# Patient Record
Sex: Male | Born: 1985
Health system: Southern US, Community
[De-identification: ages and names within clinical notes are randomized; demographics above are authoritative.]

## PROBLEM LIST (undated history)

## (undated) DIAGNOSIS — I10 Essential (primary) hypertension: Secondary | ICD-10-CM

## (undated) HISTORY — PX: APPENDECTOMY: SHX54

---
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
## 2007-01-07 ENCOUNTER — Other Ambulatory Visit: Payer: Self-pay

## 2007-01-07 ENCOUNTER — Emergency Department: Payer: Self-pay | Admitting: Emergency Medicine

## 2007-02-03 ENCOUNTER — Ambulatory Visit: Payer: Self-pay | Admitting: Unknown Physician Specialty

## 2007-02-19 ENCOUNTER — Emergency Department: Payer: Self-pay | Admitting: Unknown Physician Specialty

## 2007-03-06 ENCOUNTER — Emergency Department: Payer: Self-pay | Admitting: Emergency Medicine

## 2007-03-06 ENCOUNTER — Other Ambulatory Visit: Payer: Self-pay

## 2007-03-26 ENCOUNTER — Emergency Department: Payer: Self-pay | Admitting: Emergency Medicine

## 2007-05-22 ENCOUNTER — Emergency Department: Payer: Self-pay | Admitting: Emergency Medicine

## 2007-06-06 ENCOUNTER — Inpatient Hospital Stay: Payer: Self-pay | Admitting: Surgery

## 2008-02-03 ENCOUNTER — Emergency Department: Payer: Self-pay | Admitting: Emergency Medicine

## 2008-05-13 IMAGING — CT CT ABD-PELV W/ CM
1 of 2 series · 15 of 32 positions shown, 19 images · non-contrast
Comparison: none

REASON FOR EXAM: Abdominal pain, started in upper mid - now right mid, WBC
22k, worrisome for appendicitis
COMMENTS:   LMP: (Male)

[Series 2: appendicitis · axial · 0.79mm/px · z∈[-458,-2]mm · 15 of 166 slices shown, 19 images]
[im 7/166  soft-tissue]
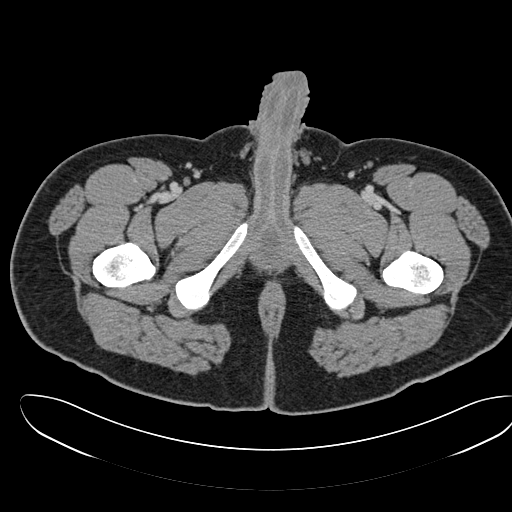
[im 7/166  bone]
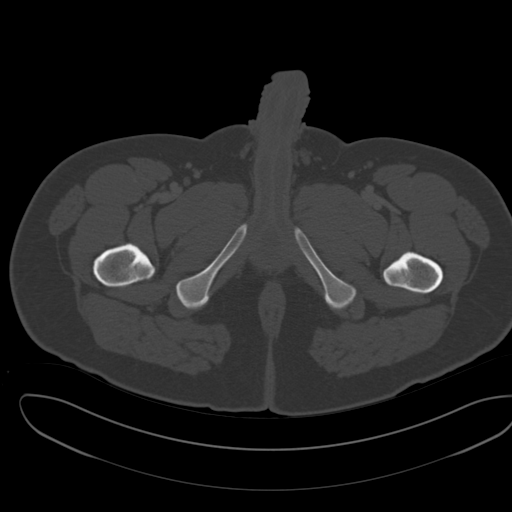
[im 20/166  soft-tissue]
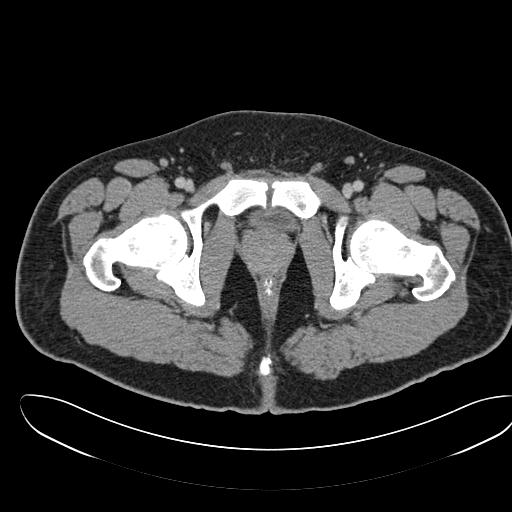
[im 32/166  soft-tissue]
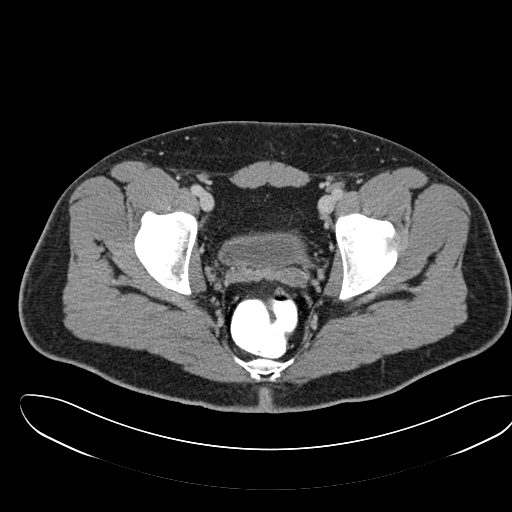
[im 45/166  soft-tissue]
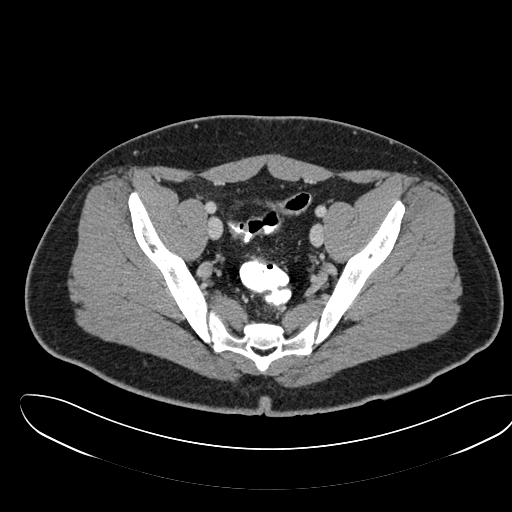
[im 58/166  soft-tissue]
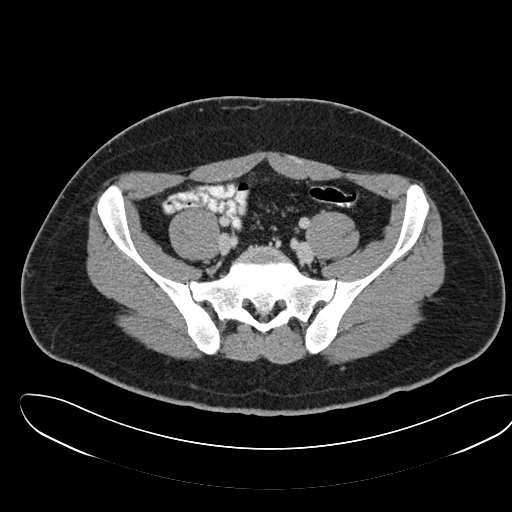
[im 70/166  soft-tissue]
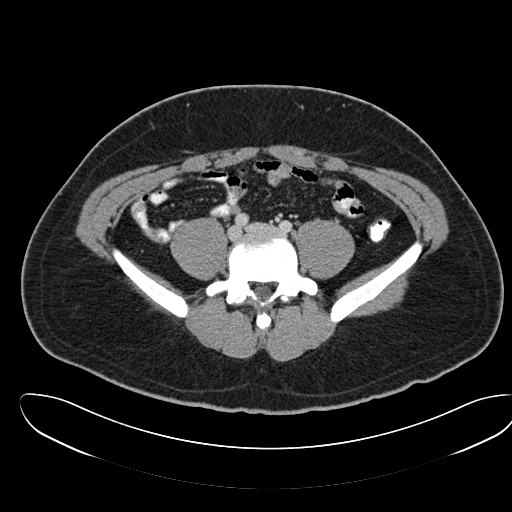
[im 83/166  soft-tissue]
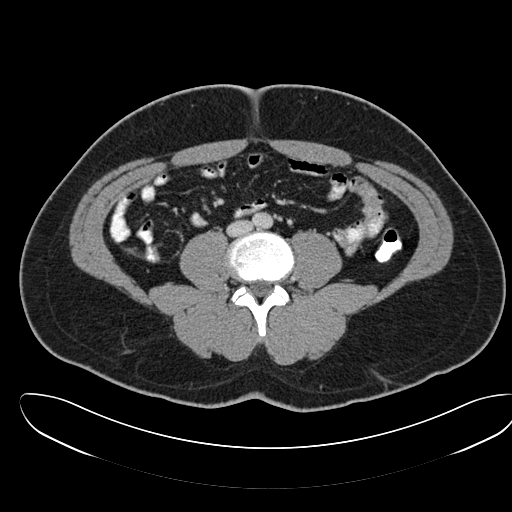
[im 96/166  soft-tissue]
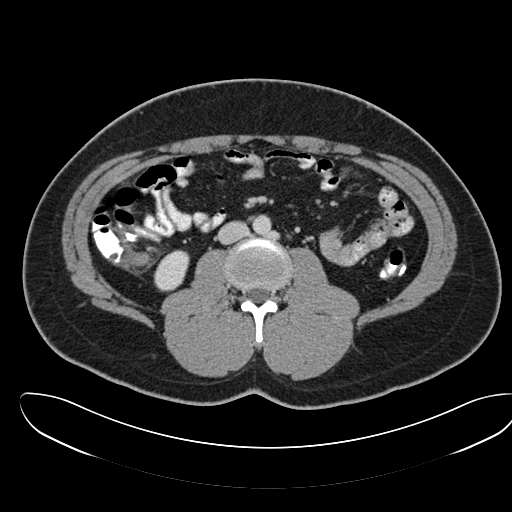
[im 108/166  soft-tissue]
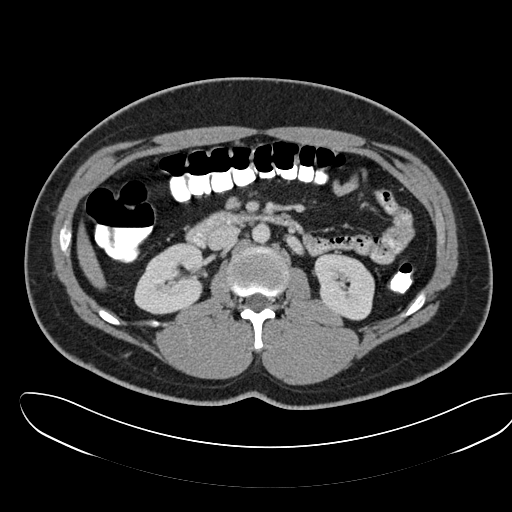
[im 108/166  bone]
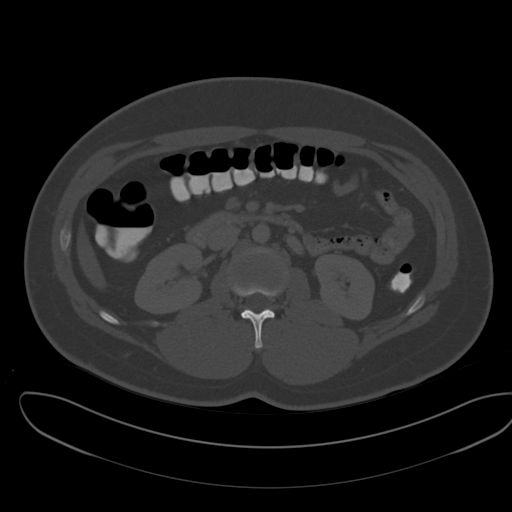
[im 121/166  soft-tissue]
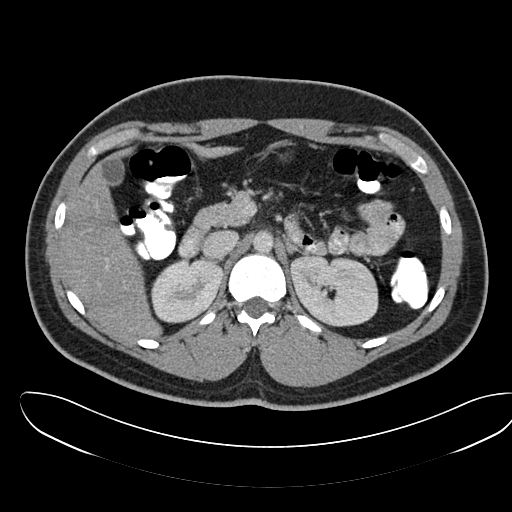
[im 134/166  soft-tissue]
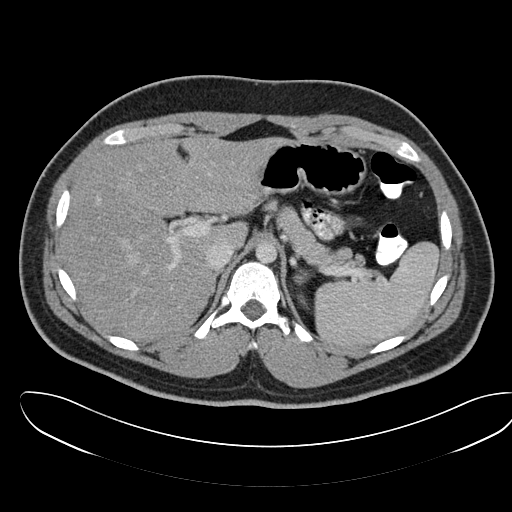
[im 140/166  lung]
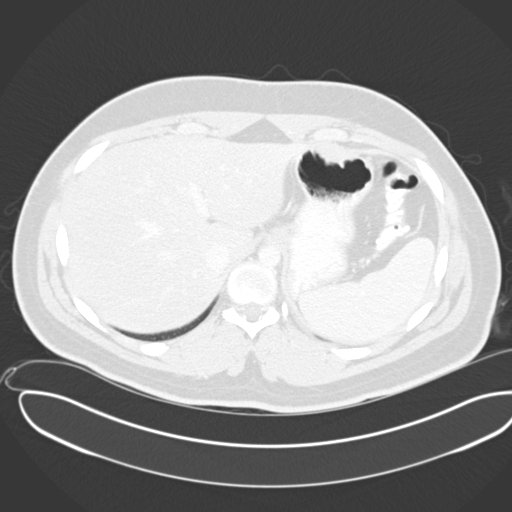
[im 146/166  soft-tissue]
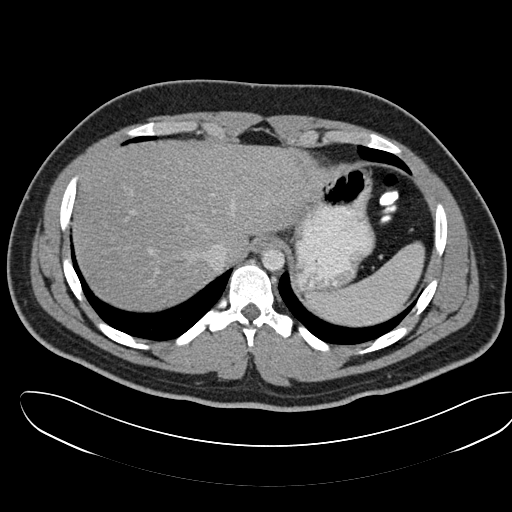
[im 146/166  lung]
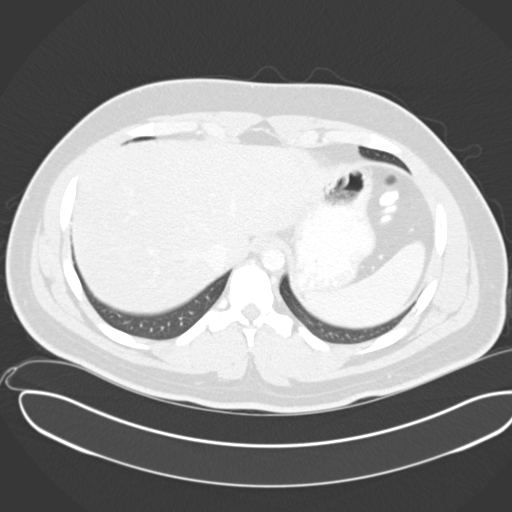
[im 153/166  lung]
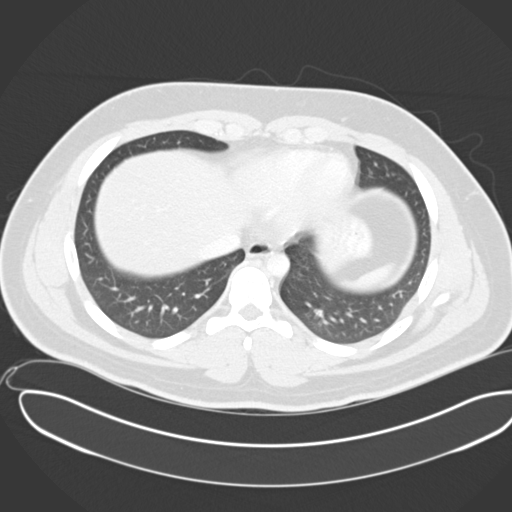
[im 159/166  soft-tissue]
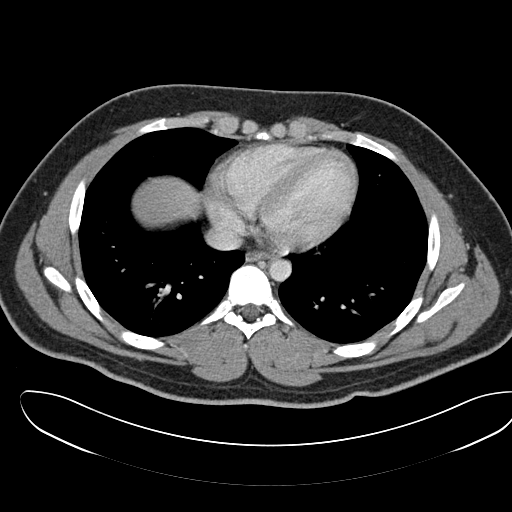
[im 159/166  lung]
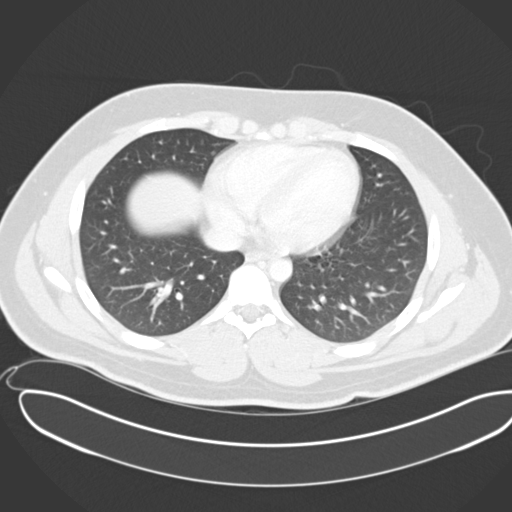

[15 of 32 positions shown; findings below may reference images not displayed]

PROCEDURE:     CT  - CT ABDOMEN / PELVIS  W  - June 06, 2007  [DATE]

RESULT:     Emergent CT scan of the abdomen and pelvis is performed
utilizing 100 ml of [id number] along with oral contrast.

Comparison is made to previous noncontrast images dated 12/23/2006 and
09/10/2006.

There is thickening of the wall of the appendix with periappendiceal
inflammatory stranding. There is no free air or free fluid. There is no
abscess formation evident. There is no bowel obstruction. The urinary
bladder is nondistended. There is some scattered diverticulosis in the
distal descending colon. The kidneys enhance normally. The aorta, liver,
spleen, pancreas and gallbladder are unremarkable. The lung bases appear to
be normally aerated.
IMPRESSION: Findings positive for appendicitis. There is no abscess
formation or evidence of perforation.

## 2008-07-15 ENCOUNTER — Emergency Department: Payer: Self-pay | Admitting: Emergency Medicine

## 2008-09-24 ENCOUNTER — Emergency Department: Payer: Self-pay | Admitting: Emergency Medicine

## 2009-01-08 ENCOUNTER — Emergency Department (HOSPITAL_COMMUNITY): Admission: EM | Admit: 2009-01-08 | Discharge: 2009-01-08 | Payer: Self-pay | Admitting: Emergency Medicine

## 2009-12-10 ENCOUNTER — Emergency Department: Payer: Self-pay | Admitting: Unknown Physician Specialty

## 2010-01-24 ENCOUNTER — Emergency Department: Payer: Self-pay | Admitting: Emergency Medicine

## 2010-03-05 ENCOUNTER — Emergency Department: Payer: Self-pay | Admitting: Emergency Medicine

## 2010-04-07 ENCOUNTER — Emergency Department: Payer: Self-pay | Admitting: Emergency Medicine

## 2010-06-04 ENCOUNTER — Emergency Department: Payer: Self-pay | Admitting: Emergency Medicine

## 2010-10-08 ENCOUNTER — Emergency Department: Payer: Self-pay | Admitting: Emergency Medicine

## 2011-04-04 ENCOUNTER — Emergency Department: Payer: Self-pay | Admitting: Internal Medicine

## 2012-05-26 ENCOUNTER — Emergency Department: Payer: Self-pay | Admitting: Emergency Medicine

## 2012-05-26 LAB — BASIC METABOLIC PANEL
Anion Gap: 9 (ref 7–16)
Calcium, Total: 9.1 mg/dL (ref 8.5–10.1)
Chloride: 108 mmol/L — ABNORMAL HIGH (ref 98–107)
Co2: 26 mmol/L (ref 21–32)
Osmolality: 285 (ref 275–301)

## 2012-05-26 LAB — CBC
HCT: 46.9 % (ref 40.0–52.0)
HGB: 16.5 g/dL (ref 13.0–18.0)
MCHC: 35.2 g/dL (ref 32.0–36.0)
MCV: 92 fL (ref 80–100)
Platelet: 296 10*3/uL (ref 150–440)
RBC: 5.09 10*6/uL (ref 4.40–5.90)
RDW: 12.3 % (ref 11.5–14.5)

## 2013-07-22 ENCOUNTER — Emergency Department: Payer: Self-pay | Admitting: Emergency Medicine

## 2015-10-18 ENCOUNTER — Emergency Department
Admission: EM | Admit: 2015-10-18 | Discharge: 2015-10-18 | Disposition: A | Discharge status: Home / Self Care | Payer: Self-pay | Attending: Emergency Medicine | Admitting: Emergency Medicine

## 2015-10-18 ENCOUNTER — Encounter: Payer: Self-pay | Admitting: Medical Oncology

## 2015-10-18 DIAGNOSIS — J302 Other seasonal allergic rhinitis: Secondary | ICD-10-CM | POA: Insufficient documentation

## 2015-10-18 DIAGNOSIS — I10 Essential (primary) hypertension: Secondary | ICD-10-CM | POA: Insufficient documentation

## 2015-10-18 DIAGNOSIS — R42 Dizziness and giddiness: Secondary | ICD-10-CM | POA: Insufficient documentation

## 2015-10-18 HISTORY — DX: Essential (primary) hypertension: I10

## 2015-10-18 LAB — CBC WITH DIFFERENTIAL/PLATELET
BASOS ABS: 0.1 10*3/uL (ref 0–0.1)
BASOS PCT: 1 %
EOS PCT: 11 %
Eosinophils Absolute: 0.9 10*3/uL — ABNORMAL HIGH (ref 0–0.7)
HCT: 45.4 % (ref 40.0–52.0)
Hemoglobin: 15.9 g/dL (ref 13.0–18.0)
Lymphocytes Relative: 33 %
Lymphs Abs: 2.9 10*3/uL (ref 1.0–3.6)
MCH: 31.6 pg (ref 26.0–34.0)
MCHC: 35.1 g/dL (ref 32.0–36.0)
MCV: 90 fL (ref 80.0–100.0)
MONO ABS: 0.7 10*3/uL (ref 0.2–1.0)
Monocytes Relative: 8 %
Neutro Abs: 4.2 10*3/uL (ref 1.4–6.5)
Neutrophils Relative %: 47 %
PLATELETS: 276 10*3/uL (ref 150–440)
RBC: 5.04 MIL/uL (ref 4.40–5.90)
RDW: 13.1 % (ref 11.5–14.5)
WBC: 8.8 10*3/uL (ref 3.8–10.6)

## 2015-10-18 LAB — COMPREHENSIVE METABOLIC PANEL
ALBUMIN: 4.8 g/dL (ref 3.5–5.0)
ALT: 39 U/L (ref 17–63)
AST: 29 U/L (ref 15–41)
Alkaline Phosphatase: 65 U/L (ref 38–126)
Anion gap: 6 (ref 5–15)
BUN: 12 mg/dL (ref 6–20)
CHLORIDE: 106 mmol/L (ref 101–111)
CO2: 25 mmol/L (ref 22–32)
Calcium: 9.3 mg/dL (ref 8.9–10.3)
Creatinine, Ser: 0.92 mg/dL (ref 0.61–1.24)
GFR calc Af Amer: >60 mL/min (ref >60)
GFR calc non Af Amer: >60 mL/min (ref >60)
GLUCOSE: 101 mg/dL — AB (ref 65–99)
POTASSIUM: 3.9 mmol/L (ref 3.5–5.1)
SODIUM: 137 mmol/L (ref 135–145)
Total Bilirubin: 1 mg/dL (ref 0.3–1.2)
Total Protein: 8.1 g/dL (ref 6.5–8.1)

## 2015-10-18 LAB — TROPONIN I

## 2015-10-18 MED ORDER — FLUTICASONE PROPIONATE 50 MCG/ACT NA SUSP
2.0000 | Freq: Every day | NASAL | Status: DC
Start: 2015-10-18 — End: 2020-03-20

## 2015-10-18 NOTE — ED Notes (Signed)
Pt reports that he began having dizziness this am. Pt reports history of same in past when he was diagnosed with HTN. Pt reports he has been compliant with his BP meds. Pt denies pain.

## 2015-10-18 NOTE — Discharge Instructions (Signed)
Allergies An allergy is an abnormal reaction to a substance by the body's defense system (immune system). Allergies can develop at any age. WHAT CAUSES ALLERGIES? An allergic reaction happens when the immune system mistakenly reacts to a normally harmless substance, called an allergen, as if it were harmful. The immune system releases antibodies to fight the substance. Antibodies eventually release a chemical called histamine into the bloodstream. The release of histamine is meant to protect the body from infection, but it also causes discomfort. An allergic reaction can be triggered by:  Eating an allergen.  Inhaling an allergen.  Touching an allergen. WHAT TYPES OF ALLERGIES ARE THERE? There are many types of allergies. Common types include:  Seasonal allergies. People with this type of allergy are usually allergic to substances that are only present during certain seasons, such as molds and pollens.  Food allergies.  Drug allergies.  Insect allergies.  Animal dander allergies. WHAT ARE SYMPTOMS OF ALLERGIES? Possible allergy symptoms include:  Swelling of the lips, face, tongue, mouth, or throat.  Sneezing, coughing, or wheezing.  Nasal congestion.  Tingling in the mouth.  Rash.  Itching.  Itchy, red, swollen areas of skin (hives).  Watery eyes.  Vomiting.  Diarrhea.  Dizziness.  Lightheadedness.  Fainting.  Trouble breathing or swallowing.  Chest tightness.  Rapid heartbeat. HOW ARE ALLERGIES DIAGNOSED? Allergies are diagnosed with a medical and family history and one or more of the following:  Skin tests.  Blood tests.  A food diary. A food diary is a record of all the foods and drinks you have in a day and of all the symptoms you experience.  The results of an elimination diet. An elimination diet involves eliminating foods from your diet and then adding them back in one by one to find out if a certain food causes an allergic reaction. HOW ARE  ALLERGIES TREATED? There is no cure for allergies, but allergic reactions can be treated with medicine. Severe reactions usually need to be treated at a hospital. HOW CAN REACTIONS BE PREVENTED? The best way to prevent an allergic reaction is by avoiding the substance you are allergic to. Allergy shots and medicines can also help prevent reactions in some cases. People with severe allergic reactions may be able to prevent a life-threatening reaction called anaphylaxis with a medicine given right after exposure to the allergen.   This information is not intended to replace advice given to you by your health care provider. Make sure you discuss any questions you have with your health care provider.   Document Released: 10/20/2002 Document Revised: 08/17/2014 Document Reviewed: 05/08/2014 Elsevier Interactive Patient Education 2016 Else[employer] Inc.  Dizziness Dizziness is a common problem. It is a feeling of unsteadiness or light-headedness. You may feel like you are about to faint. Dizziness can lead to injury if you stumble or fall. Anyone can become dizzy, but dizziness is more common in older adults. This condition can be caused by a number of things, including medicines, dehydration, or illness. HOME CARE INSTRUCTIONS Taking these steps may help with your condition: Eating and Drinking  Drink enough fluid to keep your urine clear or pale yellow. This helps to keep you from becoming dehydrated. Try to drink more clear fluids, such as water.  Do not drink alcohol.  Limit your caffeine intake if directed by your health care provider.  Limit your salt intake if directed by your health care provider. Activity  Avoid making quick movements.  Rise slowly from chairs and steady yourself  until you feel okay.  In the morning, first sit up on the side of the bed. When you feel okay, stand slowly while you hold onto something until you know that your balance is fine.  Move your legs often if you  need to stand in one place for a long time. Tighten and relax your muscles in your legs while you are standing.  Do not drive or operate heavy machinery if you feel dizzy.  Avoid bending down if you feel dizzy. Place items in your home so that they are easy for you to reach without leaning over. Lifestyle  Do not use any tobacco products, including cigarettes, chewing tobacco, or electronic cigarettes. If you need help quitting, ask your health care provider.  Try to reduce your stress level, such as with yoga or meditation. Talk with your health care provider if you need help. General Instructions  Watch your dizziness for any changes.  Take medicines only as directed by your health care provider. Talk with your health care provider if you think that your dizziness is caused by a medicine that you are taking.  Tell a friend or a family member that you are feeling dizzy. If he or she notices any changes in your behavior, have this person call your health care provider.  Keep all follow-up visits as directed by your health care provider. This is important. SEEK MEDICAL CARE IF:  Your dizziness does not go away.  Your dizziness or light-headedness gets worse.  You feel nauseous.  You have reduced hearing.  You have new symptoms.  You are unsteady on your feet or you feel like the room is spinning. SEEK IMMEDIATE MEDICAL CARE IF:  You vomit or have diarrhea and are unable to eat or drink anything.  You have problems talking, walking, swallowing, or using your arms, hands, or legs.  You feel generally weak.  You are not thinking clearly or you have trouble forming sentences. It may take a friend or family member to notice this.  You have chest pain, abdominal pain, shortness of breath, or sweating.  Your vision changes.  You notice any bleeding.  You have a headache.  You have neck pain or a stiff neck.  You have a fever.   This information is not intended to replace  advice given to you by your health care provider. Make sure you discuss any questions you have with your health care provider.   Document Released: 01/20/2001 Document Revised: 12/11/2014 Document Reviewed: 07/23/2014 Elsevier Interactive Patient Education [employer]2016 Elsevier Inc.

## 2015-10-18 NOTE — ED Notes (Signed)
Pt states that he is dizzy most every morning he gets up, states that he mostly gets straight up out if bed without sitting on the side first. Pt also states that he noticed when he laying down looking back or side to side he becomes dizzy as well, pt denies hx of vertigo or inner ear issues, pt states that he is always concerned that either his bp is too high or too low, pt states that his primary dr has been adjusting his bp meds, pt also states that he notices that he has been very tired lately. No distress noted at this time and denies any pain, cont to monitor

## 2015-10-18 NOTE — ED Provider Notes (Addendum)
[facility] Emergency Department Provider Note     Time seen: ----------------------------------------- 8:49 AM on 10/18/2015 -----------------------------------------    I have reviewed the triage vital signs and the nursing notes.   HISTORY  Chief Complaint Dizziness    HPI Collin Mccarthy is a 30 y.o. male who presents to ER with dizziness that started this morning. Patient states he had history of same and was diagnosed with high blood pressure. Patient states been compliant with his blood pressure medications. Currently he is having some allergy or sinus symptoms, nothing makes his symptoms better or worse.   Past Medical History  Diagnosis Date  . Hypertension     There are no active problems to display for this patient.   History reviewed. No pertinent past surgical history.  Allergies Review of patient's allergies indicates no known allergies.  Social History Social History  Substance Use Topics  . Smoking status: Never Smoker   . Smokeless tobacco: None  . Alcohol Use: None    Review of Systems Constitutional: Negative for fever. Eyes: Negative for visual changes. ENT: Negative for sore throat.Positive for sinus congestion Cardiovascular: Negative for chest pain. Respiratory: Negative for shortness of breath. Gastrointestinal: Negative for abdominal pain, vomiting and diarrhea. Genitourinary: Negative for dysuria. Musculoskeletal: Negative for back pain. Skin: Negative for rash. Neurological: Negative for headaches, focal weakness or numbness.Positive for dizziness  10-point ROS otherwise negative.  ____________________________________________   PHYSICAL EXAM:  VITAL SIGNS: ED Triage Vitals  Enc Vitals Group     BP 10/18/15 0842 154/95 mmHg     Pulse Rate 10/18/15 0842 68     Resp 10/18/15 0842 17     Temp 10/18/15 0842 97.6 F (36.4 C)     Temp Source 10/18/15 0842 Oral     SpO2 10/18/15 0842 100 %   Weight 10/18/15 0842 250 lb (113.399 kg)     Height 10/18/15 0842  (1.727 m)     Head Cir --      Peak Flow --      Pain Score --      Pain Loc --      Pain Edu? --      Excl. in GC? --     Constitutional: Alert and oriented. Well appearing and in no distress. Eyes: Conjunctivae are normal. PERRL. Normal extraocular movements. ENT   Head: Normocephalic and atraumatic.   Nose: No congestion/rhinnorhea.   Mouth/Throat: Mucous membranes are moist.   Neck: No stridor. Cardiovascular: Normal rate, regular rhythm. Normal and symmetric distal pulses are present in all extremities. No murmurs, rubs, or gallops. Respiratory: Normal respiratory effort without tachypnea nor retractions. Breath sounds are clear and equal bilaterally. No wheezes/rales/rhonchi. Gastrointestinal: Soft and nontender. No distention. No abdominal bruits.  Musculoskeletal: Nontender with normal range of motion in all extremities. No joint effusions.  No lower extremity tenderness nor edema. Neurologic:  Normal speech and language. No gross focal neurologic deficits are appreciated. Speech is normal. No gait instability. Skin:  Skin is warm, dry and intact. No rash noted. Psychiatric: Mood and affect are normal. Speech and behavior are normal. Patient exhibits appropriate insight and judgment. ____________________________________________  EKG: Interpreted by me. Normal sinus rhythm with a rate of 79 bpm, normal PR interval, normal QRS, normal QT interval. Normal EKG  ____________________________________________  ED COURSE:  Pertinent labs & imaging results that were available during my care of the patient were reviewed by me and considered in my medical decision making (see chart for  details). Patient looks well, likely multifactorial. We'll check basic labs and reevaluate. ____________________________________________    LABS (pertinent positives/negatives)  Labs Reviewed  CBC WITH  DIFFERENTIAL/PLATELET - Abnormal; Notable for the following:    Eosinophils Absolute 0.9 (*)    All other components within normal limits  COMPREHENSIVE METABOLIC PANEL - Abnormal; Notable for the following:    Glucose, Bld 101 (*)    All other components within normal limits  TROPONIN I   ____________________________________________  FINAL ASSESSMENT AND PLAN  Dizziness  Plan: Patient with labs as dictated above. Patient is in no acute distress, labs are unremarkable. He'll be discharged with Flonase for seasonal allergy and encouraged to have close follow-up with his doctor for recheck. I suspect whenever his sinuses become inflamed or when he is exposed to a seasonal allergen this triggers mild vertigo.   Emily FilbertWilliams, Tiaria Biby E, MD   Emily FilbertJonathan E Manvir Prabhu, MD 10/18/15 [id number]  Emily FilbertJonathan E Kerim Statzer, MD 10/18/15 [phone]

## 2016-06-18 ENCOUNTER — Emergency Department
Admission: EM | Admit: 2016-06-18 | Discharge: 2016-06-18 | Disposition: A | Discharge status: Home / Self Care | Payer: Self-pay | Attending: Emergency Medicine | Admitting: Emergency Medicine

## 2016-06-18 ENCOUNTER — Encounter: Payer: Self-pay | Admitting: Emergency Medicine

## 2016-06-18 ENCOUNTER — Emergency Department: Payer: Self-pay

## 2016-06-18 DIAGNOSIS — Z7951 Long term (current) use of inhaled steroids: Secondary | ICD-10-CM | POA: Insufficient documentation

## 2016-06-18 DIAGNOSIS — G629 Polyneuropathy, unspecified: Secondary | ICD-10-CM | POA: Insufficient documentation

## 2016-06-18 DIAGNOSIS — R202 Paresthesia of skin: Secondary | ICD-10-CM

## 2016-06-18 DIAGNOSIS — I10 Essential (primary) hypertension: Secondary | ICD-10-CM | POA: Insufficient documentation

## 2016-06-18 MED ORDER — METHYLPREDNISOLONE 4 MG PO TBPK
ORAL_TABLET | ORAL | 0 refills | Status: DC
Start: 2016-06-18 — End: 2020-03-20

## 2016-06-18 MED ORDER — TRAMADOL HCL 50 MG PO TABS: 50.0000 mg | ORAL_TABLET | Freq: Four times a day (QID) | ORAL | 0 refills | Status: AC | PRN

## 2016-06-18 MED ORDER — BENZONATATE 100 MG PO CAPS
200.0000 mg | ORAL_CAPSULE | Freq: Once | ORAL | Status: AC
  Administered 2016-06-18: 200 mg via ORAL
  Filled 2016-06-18: qty 2

## 2016-06-18 NOTE — Discharge Instructions (Signed)
Wear wrist support at work.

## 2016-06-18 NOTE — ED Provider Notes (Signed)
[facility] Emergency Department Provider Note   ____________________________________________   First MD Initiated Contact with Patient 06/18/16 0745     (approximate)  I have reviewed the triage vital signs and the nursing notes.   HISTORY  Chief Complaint Numbness    HPI Collin Mccarthy is a 30 y.o. male patient complaining the numbness to the right hand for 1 month. Paced the incident occurred after he struck his distal radius with a hammer on changing brakes. Patient state his has been seen by his PCP with no definitive diagnosis. Patient state he is having intermittent pain and tingling to first through third digits. Patient denies loss of strength. Patient also has a nonproductive cough for 4 days. Patient denies any other URI signs symptoms.Patient denies pain.   Past Medical History:  Diagnosis Date  . Hypertension     There are no active problems to display for this patient.   History reviewed. No pertinent surgical history.  Prior to Admission medications   Medication Sig Start Date End Date Taking? Authorizing Provider  fluticasone (FLONASE) 50 MCG/ACT nasal spray Place 2 sprays into both nostrils daily. 10/18/15 10/17/16  Emily FilbertJonathan E Williams, MD  methylPREDNISolone (MEDROL DOSEPAK) 4 MG TBPK tablet Take Tapered dose as directed 06/18/16   Joni Reiningonald K Smith, PA-C  traMADol (ULTRAM) 50 MG tablet Take 1 tablet (50 mg total) by mouth every 6 (six) hours as needed. 06/18/16 06/18/17  Joni Reiningonald K Smith, PA-C    Allergies Patient has no known allergies.  No family history on file.  Social History Social History  Substance Use Topics  . Smoking status: Never Smoker  . Smokeless tobacco: Never Used  . Alcohol use Not on file    Review of Systems Constitutional: No fever/chills Eyes: No visual changes. ENT: No sore throat. Cardiovascular: Denies chest pain. Respiratory: Denies shortness of breath. Gastrointestinal: No abdominal pain.   No nausea, no vomiting.  No diarrhea.  No constipation. Genitourinary: Negative for dysuria. Musculoskeletal: Negative for back pain. Skin: Negative for rash. Neurological: Negative for headaches, focal weakness or numbness. No meningeal tingling to the right first through third digits.    ____________________________________________   PHYSICAL EXAM:  VITAL SIGNS: ED Triage Vitals  Enc Vitals Group     BP 06/18/16 0656 135/76     Pulse Rate 06/18/16 0656 84     Resp 06/18/16 0656 18     Temp 06/18/16 0656 98.1 F (36.7 C)     Temp Source 06/18/16 0656 Oral     SpO2 06/18/16 0656 98 %     Weight 06/18/16 0638 250 lb (113.4 kg)     Height 06/18/16 0638 5\' 9"  (1.753 m)     Head Circumference --      Peak Flow --      Pain Score 06/18/16 0723 0     Pain Loc --      Pain Edu? --      Excl. in GC? --     Constitutional: Alert and oriented. Well appearing and in no acute distress. Eyes: Conjunctivae are normal. PERRL. EOMI. Head: Atraumatic. Nose: No congestion/rhinnorhea. Mouth/Throat: Mucous membranes are moist.  Oropharynx non-erythematous. Neck: No stridor.  No cervical spine tenderness to palpation. Hematological/Lymphatic/Immunilogical: No cervical lymphadenopathy. Cardiovascular: Normal rate, regular rhythm. Grossly normal heart sounds.  Good peripheral circulation. Respiratory: Normal respiratory effort.  No retractions. Lungs CTAB. Nonproductive cough Gastrointestinal: Soft and nontender. No distention. No abdominal bruits. No CVA tenderness. Musculoskeletal: No lower extremity tenderness  nor edema.  No joint effusions. Neurologic:  Normal speech and language. No gross focal neurologic deficits are appreciated. No gait instability. Skin:  Skin is warm, dry and intact. No rash noted. Psychiatric: Mood and affect are normal. Speech and behavior are normal.  ____________________________________________   LABS (all labs ordered are listed, but only abnormal results  are displayed)  Labs Reviewed - No data to display ____________________________________________  EKG   ____________________________________________  RADIOLOGY  No acute findings x-ray of the right wrist ____________________________________________   PROCEDURES  Procedure(s) performed: None  Procedures  Critical Care performed: No  ____________________________________________   INITIAL IMPRESSION / ASSESSMENT AND PLAN / ED COURSE  Pertinent labs & imaging results that were available during my care of the patient were reviewed by me and considered in my medical decision making (see chart for details).  Neuropathy of the right hand. Discussed negative x-ray finding with patient. Patient given discharge Instructions. Patient advised follow-up with neurology for EMG studies.  Clinical Course    Patient placed in a hand splint and started on prednisone.  ____________________________________________   FINAL CLINICAL IMPRESSION(S) / ED DIAGNOSES  Final diagnoses:  Paresthesias      NEW MEDICATIONS STARTED DURING THIS VISIT:  New Prescriptions   METHYLPREDNISOLONE (MEDROL DOSEPAK) 4 MG TBPK TABLET    Take Tapered dose as directed   TRAMADOL (ULTRAM) 50 MG TABLET    Take 1 tablet (50 mg total) by mouth every 6 (six) hours as needed.     Note:  This document was prepared using Dragon voice recognition software and may include unintentional dictation errors.    Joni ReiningRonald K Smith, PA-C 06/18/16 [id number]    Myrna Blazeravid Matthew Schaevitz, MD 06/18/16 [phone]

## 2016-06-18 NOTE — ED Notes (Signed)
Patient presents to the ED with intermittent right hand pain and numbness.  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  Patient also is complaining of cough x 4 days.  Patient is in no obvious distress at this time.  Denies pain at this time.

## 2016-06-18 NOTE — ED Triage Notes (Signed)
Patient ambulatory to triage with steady gait, without difficulty or distress noted; pt reports right hand numbness x month; has seen PCP with no dx

## 2018-05-07 ENCOUNTER — Emergency Department: Payer: Medicaid Other

## 2018-05-07 ENCOUNTER — Other Ambulatory Visit: Payer: Self-pay

## 2018-05-07 ENCOUNTER — Encounter: Payer: Self-pay | Admitting: Emergency Medicine

## 2018-05-07 ENCOUNTER — Emergency Department
Admission: EM | Admit: 2018-05-07 | Discharge: 2018-05-07 | Disposition: A | Discharge status: Home / Self Care | Payer: Medicaid Other | Attending: Student in an Organized Health Care Education/Training Program | Admitting: Student in an Organized Health Care Education/Training Program

## 2018-05-07 DIAGNOSIS — R0789 Other chest pain: Secondary | ICD-10-CM | POA: Insufficient documentation

## 2018-05-07 DIAGNOSIS — I10 Essential (primary) hypertension: Secondary | ICD-10-CM | POA: Insufficient documentation

## 2018-05-07 LAB — CBC WITH DIFFERENTIAL/PLATELET
Basophils Absolute: 0.2 10*3/uL — ABNORMAL HIGH (ref 0–0.1)
Basophils Relative: 2 %
Eosinophils Absolute: 0.4 10*3/uL (ref 0–0.7)
Eosinophils Relative: 3 %
HCT: 47.4 % (ref 40.0–52.0)
Hemoglobin: 16.8 g/dL (ref 13.0–18.0)
Lymphocytes Relative: 33 %
Lymphs Abs: 4.5 10*3/uL — ABNORMAL HIGH (ref 1.0–3.6)
MCH: 32.7 pg (ref 26.0–34.0)
MCHC: 35.4 g/dL (ref 32.0–36.0)
MCV: 92.4 fL (ref 80.0–100.0)
Monocytes Absolute: 1.2 10*3/uL — ABNORMAL HIGH (ref 0.2–1.0)
Monocytes Relative: 9 %
Neutro Abs: 7.1 10*3/uL — ABNORMAL HIGH (ref 1.4–6.5)
Neutrophils Relative %: 53 %
Platelets: 351 10*3/uL (ref 150–440)
RBC: 5.13 MIL/uL (ref 4.40–5.90)
RDW: 12.8 % (ref 11.5–14.5)
WBC: 13.5 10*3/uL — ABNORMAL HIGH (ref 3.8–10.6)

## 2018-05-07 LAB — BASIC METABOLIC PANEL
Anion gap: 10 (ref 5–15)
BUN: 11 mg/dL (ref 6–20)
CO2: 25 mmol/L (ref 22–32)
Calcium: 9.7 mg/dL (ref 8.9–10.3)
Chloride: 103 mmol/L (ref 98–111)
Creatinine, Ser: 0.96 mg/dL (ref 0.61–1.24)
GFR calc Af Amer: >60 mL/min (ref >60)
GFR calc non Af Amer: >60 mL/min (ref >60)
Glucose, Bld: 112 mg/dL — ABNORMAL HIGH (ref 70–99)
Potassium: 3.4 mmol/L — ABNORMAL LOW (ref 3.5–5.1)
Sodium: 138 mmol/L (ref 135–145)

## 2018-05-07 LAB — TROPONIN I: Troponin I: <0.03 ng/mL (ref <0.03)

## 2018-05-07 LAB — FIBRIN DERIVATIVES D-DIMER (ARMC ONLY): Fibrin derivatives D-dimer (ARMC): 203.85 ng/mL (FEU) (ref 0.00–499.00)

## 2018-05-07 NOTE — ED Provider Notes (Signed)
[facility] Emergency Department Provider Note    First MD Initiated Contact with Patient 05/07/18 2024     (approximate)  I have reviewed the triage vital signs and the nursing notes.   HISTORY  Chief Complaint Chest Pain    HPI ZAION HREHA is a 32 y.o. male with a history of hypertension presents the ER for intermittent chest pain has been on and off for the entire day while at work.  States he does have some pain when taking deep inspiration.  Episodes lasts 15 to 30 seconds.  No associated diaphoresis.  Denies any cough or fevers.  Denies any nausea or vomiting.  Denies any active chest pain right now.  No family history of sudden cardiac death.  He does not smoke.    Past Medical History:  Diagnosis Date  . Hypertension    No family history on file. Past Surgical History:  Procedure Laterality Date  . APPENDECTOMY     There are no active problems to display for this patient.     Prior to Admission medications   Medication Sig Start Date End Date Taking? Authorizing Provider  fluticasone (FLONASE) 50 MCG/ACT nasal spray Place 2 sprays into both nostrils daily. 10/18/15 10/17/16  Emily Filbert, MD  methylPREDNISolone (MEDROL DOSEPAK) 4 MG TBPK tablet Take Tapered dose as directed 06/18/16   Joni Reining, PA-C    Allergies Patient has no known allergies.    Social History Social History   Tobacco Use  . Smoking status: Never Smoker  . Smokeless tobacco: Never Used  Substance Use Topics  . Alcohol use: Never    Frequency: Never  . Drug use: Never    Review of Systems Patient denies headaches, rhinorrhea, blurry vision, numbness, shortness of breath, chest pain, edema, cough, abdominal pain, nausea, vomiting, diarrhea, dysuria, fevers, rashes or hallucinations unless otherwise stated above in HPI. ____________________________________________   PHYSICAL EXAM:  VITAL SIGNS: Vitals:   05/07/18 2100 05/07/18 2130    BP: 112/70 107/76  Pulse: 86 77  Resp: 18 18  SpO2: 98% 99%    Constitutional: Alert and oriented.  Eyes: Conjunctivae are normal.  Head: Atraumatic. Nose: No congestion/rhinnorhea. Mouth/Throat: Mucous membranes are moist.   Neck: No stridor. Painless ROM.  Cardiovascular: Normal rate, regular rhythm. Grossly normal heart sounds.  Good peripheral circulation. Respiratory: Normal respiratory effort.  No retractions. Lungs CTAB. Gastrointestinal: Soft and nontender. No distention. No abdominal bruits. No CVA tenderness. Genitourinary: deferred Musculoskeletal: No lower extremity tenderness nor edema.  No joint effusions. Neurologic:  Normal speech and language. No gross focal neurologic deficits are appreciated. No facial droop Skin:  Skin is warm, dry and intact. No rash noted. Psychiatric: Mood and affect are normal. Speech and behavior are normal.  ____________________________________________   LABS (all labs ordered are listed, but only abnormal results are displayed)  Results for orders placed or performed during the hospital encounter of 05/07/18 (from the past 24 hour(s))  Troponin I     Status: None   Collection Time: 05/07/18  8:25 PM  Result Value Ref Range   Troponin I <0.03 <0.03 ng/mL  CBC with Differential/Platelet     Status: Abnormal   Collection Time: 05/07/18  8:25 PM  Result Value Ref Range   WBC 13.5 (H) 3.8 - 10.6 K/uL   RBC 5.13 4.40 - 5.90 MIL/uL   Hemoglobin 16.8 13.0 - 18.0 g/dL   HCT 96.0 45.4 - 09.8 %   MCV 92.4 80.0 -  100.0 fL   MCH 32.7 26.0 - 34.0 pg   MCHC 35.4 32.0 - 36.0 g/dL   RDW 34.7 42.5 - 95.6 %   Platelets 351 150 - 440 K/uL   Neutrophils Relative % 53 %   Neutro Abs 7.1 (H) 1.4 - 6.5 K/uL   Lymphocytes Relative 33 %   Lymphs Abs 4.5 (H) 1.0 - 3.6 K/uL   Monocytes Relative 9 %   Monocytes Absolute 1.2 (H) 0.2 - 1.0 K/uL   Eosinophils Relative 3 %   Eosinophils Absolute 0.4 0 - 0.7 K/uL   Basophils Relative 2 %   Basophils  Absolute 0.2 (H) 0 - 0.1 K/uL  Basic metabolic panel     Status: Abnormal   Collection Time: 05/07/18  8:25 PM  Result Value Ref Range   Sodium 138 135 - 145 mmol/L   Potassium 3.4 (L) 3.5 - 5.1 mmol/L   Chloride 103 98 - 111 mmol/L   CO2 25 22 - 32 mmol/L   Glucose, Bld 112 (H) 70 - 99 mg/dL   BUN 11 6 - 20 mg/dL   Creatinine, Ser 3.87 0.61 - 1.24 mg/dL   Calcium 9.7 8.9 - 56.4 mg/dL   GFR calc non Af Amer >60 >60 mL/min   GFR calc Af Amer >60 >60 mL/min   Anion gap 10 5 - 15  Fibrin derivatives D-Dimer ([facility] only)     Status: None   Collection Time: 05/07/18  8:57 PM  Result Value Ref Range   Fibrin derivatives D-dimer ([facility]) 203.85 0.00 - 499.00 ng/mL (FEU)   ____________________________________________  EKG My review and personal interpretation at Time: 20:22   Indication: htn  Rate: 105  Rhythm: sinus Axis: normal Other: normal intervals, no stemi ____________________________________________  RADIOLOGY  I personally reviewed all radiographic images ordered to evaluate for the above acute complaints and reviewed radiology reports and findings.  These findings were personally discussed with the patient.  Please see medical record for radiology report.  ____________________________________________   PROCEDURES  Procedure(s) performed:  Procedures    Critical Care performed: no ____________________________________________   INITIAL IMPRESSION / ASSESSMENT AND PLAN / ED COURSE  Pertinent labs & imaging results that were available during my care of the patient were reviewed by me and considered in my medical decision making (see chart for details).   DDX: ACS, pericarditis, esophagitis, boerhaaves, pe, dissection, pna, bronchitis, costochondritis   JODEN BONSALL is a 32 y.o. who presents to the ED with symptoms as described above.  Patient is AFVSS in ED. Exam as above. Given current presentation have considered the above differential.  Patient is low risk  heart score.  EKG and troponin are negative.  Patient low risk by Wells criteria.  Is mildly tachycardic when he arrived therefore d-dimer was sent.  That is negative.  This point this does not seem clinically consistent with PE.  This point do believe patient stable and appropriate for outpatient follow-up.       As part of my medical decision making, I reviewed the following data within the electronic MEDICAL RECORD NUMBER Nursing notes reviewed and incorporated, Labs reviewed, notes from prior ED visits.  ____________________________________________   FINAL CLINICAL IMPRESSION(S) / ED DIAGNOSES  Final diagnoses:  Atypical chest pain      NEW MEDICATIONS STARTED DURING THIS VISIT:  New Prescriptions   No medications on file     Note:  This document was prepared using Dragon voice recognition software and may include unintentional dictation  errors.    Willy Eddy, MD 05/07/18 2221

## 2018-05-07 NOTE — ED Triage Notes (Signed)
Pt arrives POV to triage with c/o of chest pain which has been going "off and on" all day at work. Pt is in NAD.

## 2018-05-07 NOTE — ED Notes (Signed)
Patient transported to X-ray 

## 2020-03-20 ENCOUNTER — Ambulatory Visit: Payer: Medicaid Other | Admitting: Family Medicine

## 2020-03-20 ENCOUNTER — Encounter: Payer: Self-pay | Admitting: Family Medicine

## 2020-03-20 ENCOUNTER — Other Ambulatory Visit: Payer: Self-pay

## 2020-03-20 DIAGNOSIS — Z113 Encounter for screening for infections with a predominantly sexual mode of transmission: Secondary | ICD-10-CM

## 2020-03-20 LAB — GRAM STAIN

## 2020-03-20 NOTE — Progress Notes (Signed)
   [facility] Department STI clinic/screening visit  Subjective:  Collin Mccarthy is a 34 y.o. male being seen today for an STI screening visit. The patient reports they do not have symptoms.    Patient has the following medical conditions:  There are no problems to display for this patient.    No chief complaint on file.   HPI  Patient reports he is here for STD screening.  He denies STD symptoms.   See flowsheet for further details and programmatic requirements.    The following portions of the patient's history were reviewed and updated as appropriate: allergies, current medications, past medical history, past social history, past surgical history and problem list.  Objective:  There were no vitals filed for this visit.  Physical Exam Constitutional:      Appearance: Normal appearance.  HENT:     Head: Normocephalic and atraumatic.     Comments: No nits or hair loss    Mouth/Throat:     Mouth: Mucous membranes are moist.     Pharynx: Oropharynx is clear. No oropharyngeal exudate or posterior oropharyngeal erythema.  Pulmonary:     Effort: Pulmonary effort is normal.  Abdominal:     General: Abdomen is flat.     Palpations: Abdomen is soft. There is no hepatomegaly or mass.     Tenderness: There is no abdominal tenderness.  Genitourinary:    Pubic Area: No rash or pubic lice.      Penis: Normal.      Testes: Normal.     Epididymis:     Right: Normal.     Left: Normal.  Lymphadenopathy:     Head:     Right side of head: No preauricular or posterior auricular adenopathy.     Left side of head: No preauricular or posterior auricular adenopathy.     Cervical: No cervical adenopathy.     Upper Body:     Right upper body: No supraclavicular or axillary adenopathy.     Left upper body: No supraclavicular or axillary adenopathy.     Lower Body: No right inguinal adenopathy. No left inguinal adenopathy.  Skin:    General: Skin is warm and dry.      Findings: No rash.  Neurological:     Mental Status: He is alert and oriented to person, place, and time.    Assessment and Plan:  Collin Mccarthy is a 34 y.o. male presenting to the [facility] Department for STI screening  1. Screening examination for venereal disease  - Gram stain - Gonococcus culture - HIV [facility] - Syphilis Serology, [place]     No follow-ups on file.  No future appointments.  Larene Pickett, FNP

## 2020-03-25 LAB — GONOCOCCUS CULTURE

## 2020-03-29 ENCOUNTER — Ambulatory Visit: Payer: Self-pay

## 2021-05-05 ENCOUNTER — Encounter (HOSPITAL_COMMUNITY): Payer: Self-pay | Admitting: Emergency Medicine

## 2021-05-05 ENCOUNTER — Emergency Department (HOSPITAL_COMMUNITY)
Admission: EM | Admit: 2021-05-05 | Discharge: 2021-05-05 | Disposition: A | Discharge status: Home / Self Care | Payer: Medicaid Other | Attending: Emergency Medicine | Admitting: Emergency Medicine

## 2021-05-05 ENCOUNTER — Other Ambulatory Visit: Payer: Self-pay

## 2021-05-05 DIAGNOSIS — S30811A Abrasion of abdominal wall, initial encounter: Secondary | ICD-10-CM | POA: Insufficient documentation

## 2021-05-05 DIAGNOSIS — X58XXXA Exposure to other specified factors, initial encounter: Secondary | ICD-10-CM | POA: Insufficient documentation

## 2021-05-05 DIAGNOSIS — L03311 Cellulitis of abdominal wall: Secondary | ICD-10-CM

## 2021-05-05 DIAGNOSIS — I10 Essential (primary) hypertension: Secondary | ICD-10-CM | POA: Insufficient documentation

## 2021-05-05 MED ORDER — DOXYCYCLINE HYCLATE 100 MG PO CAPS: 100.0000 mg | ORAL_CAPSULE | Freq: Two times a day (BID) | ORAL | 0 refills | Status: AC

## 2021-05-05 MED ORDER — MUPIROCIN CALCIUM 2 % EX CREA: 1.0000 "application " | TOPICAL_CREAM | Freq: Two times a day (BID) | CUTANEOUS | 0 refills | Status: AC

## 2021-05-05 NOTE — ED Provider Notes (Signed)
[facility] EMERGENCY DEPARTMENT Provider Note   CSN: [id number] Arrival date & time: 05/05/21  0626     History Chief Complaint  Patient presents with   Wound Check    Collin Mccarthy is a 35 y.o. male.  HPI  Patient with no significant medical history presents with chief complaint of a wound check.  Patient states he noted a boil like protrusion on his right lower abdomen he decided to pop it with a needle.  He states since then the area has been draining clear like substance and redness around the area has increased slightly.  He states he is concerned that this could be possibly MRSA.  He denies systemic infection Fevers or chills, denies history of IV drug use, he is not immunocompromise, denies associated chest pain, shortness of breath, worsening pedal edema.  Patient states that his mother recently had MRSA infection, patient never had MRSA in the past.  Patient states he has been keeping the area clean using alcohol and change out the dressings, states that has a burning-like sensation.   Past Medical History:  Diagnosis Date   Hypertension     There are no problems to display for this patient.   Past Surgical History:  Procedure Laterality Date   APPENDECTOMY         No family history on file.  Social History   Tobacco Use   Smoking status: Never   Smokeless tobacco: Never  Substance Use Topics   Alcohol use: Never   Drug use: Never    Home Medications Prior to Admission medications   Medication Sig Start Date End Date Taking? Authorizing Provider  doxycycline (VIBRAMYCIN) 100 MG capsule Take 1 capsule (100 mg total) by mouth 2 (two) times daily for 7 days. 05/05/21 05/12/21 Yes Carroll Sage, PA-C  mupirocin cream (BACTROBAN) 2 % Apply 1 application topically 2 (two) times daily. 05/05/21  Yes Carroll Sage, PA-C    Allergies    Patient has no known allergies.  Review of Systems   Review of Systems  Constitutional:  Negative for  chills and fever.  HENT:  Negative for congestion.   Respiratory:  Negative for shortness of breath.   Cardiovascular:  Negative for chest pain.  Gastrointestinal:  Negative for abdominal pain.  Genitourinary:  Negative for enuresis.  Musculoskeletal:  Negative for back pain.  Skin:  Positive for wound. Negative for rash.  Neurological:  Negative for dizziness.  Hematological:  Does not bruise/bleed easily.   Physical Exam Updated Vital Signs BP 131/84 (BP Location: Right Arm)   Pulse 84   Temp 98 F (36.7 C) (Oral)   Resp 17   SpO2 100%   Physical Exam Vitals and nursing note reviewed.  Constitutional:      General: He is not in acute distress.    Appearance: Normal appearance. He is not ill-appearing or diaphoretic.  HENT:     Head: Normocephalic and atraumatic.     Nose: No congestion or rhinorrhea.  Eyes:     Conjunctiva/sclera: Conjunctivae normal.  Cardiovascular:     Rate and Rhythm: Normal rate and regular rhythm.  Pulmonary:     Effort: Pulmonary effort is normal. No respiratory distress.     Breath sounds: Normal breath sounds. No wheezing.  Musculoskeletal:     Cervical back: Neck supple.     Right lower leg: No edema.     Left lower leg: No edema.  Skin:    General: Skin is warm and  dry.     Coloration: Skin is not jaundiced or pale.     Comments: Patient is a noted abrasion on his right lower abdomen there is surrounding erythema no drainage or discharge present, area was tender to palpation, no fluctuance or induration noted.  Limited skin was performed no noted lesions or other gross abnormalities present on patient's upper and or lower extremities, abdomen or back.  Neurological:     Mental Status: He is alert.  Psychiatric:        Mood and Affect: Mood normal.    ED Results / Procedures / Treatments   Labs (all labs ordered are listed, but only abnormal results are displayed) Labs Reviewed - No data to display  EKG None  Radiology No results  found.  Procedures Procedures   Medications Ordered in ED Medications - No data to display  ED Course  I have reviewed the triage vital signs and the nursing notes.  Pertinent labs & imaging results that were available during my care of the patient were reviewed by me and considered in my medical decision making (see chart for details).    MDM Rules/Calculators/A&P                          Initial impression-patient presents with a wound on his right lower abdomen.  He is alert, does not appear in acute stress, vital signs reassuring.  Work-up-due to well-appearing patient, benign physical exam, further lab or imaging not warranted at this time.  Rule out-low suspicion for systemic infection as patient is nontoxic-appearing, vital signs reassuring.  Low suspicion for deep tissue infection i.e. abscess there is no noted fluctuance or induration present, no clear discharge noted on my exam.  Low suspicion for bacteremia and/or septic emboli as he has no systemic infection, no noted heart murmur, no noted edema, presentation and a tubal etiology.  Plan-  Wound-likely patient has beginning for cellulitis, will cover him for MRSA start him on doxycycline.  Follow-up with PCP for further evaluation.  Vital signs have remained stable, no indication for hospital admission.  Patient given at home care as well strict return precautions.  Patient verbalized that they understood agreed to said plan.  Final Clinical Impression(s) / ED Diagnoses Final diagnoses:  Cellulitis of abdominal wall    Rx / DC Orders ED Discharge Orders          Ordered    doxycycline (VIBRAMYCIN) 100 MG capsule  2 times daily        05/05/21 1156    mupirocin cream (BACTROBAN) 2 %  2 times daily        05/05/21 1156             Barnie Del 05/05/21 1159    Pricilla Loveless, MD 05/09/21 [phone]

## 2021-05-05 NOTE — ED Triage Notes (Signed)
Pt reports wound to the right lower abdomen. Pt is concerned for MRSA in the wound. Denies fevers.

## 2021-05-05 NOTE — Discharge Instructions (Addendum)
Looks like you have a small skin infection  starting you on doxycycline please take as prescribed.  Recommend keep the wound clean  change dressing 2 times daily.  Also given you a antibiotic cream please use when you see another small bump appear on your skin do not pick at it or pop it.  This cream will help prevent further infections.  Please follow your PCP for further evaluation.  Come back to the emergency department if you develop chest pain, shortness of breath, severe abdominal pain, uncontrolled nausea, vomiting, diarrhea.

## 2022-05-26 ENCOUNTER — Encounter (HOSPITAL_COMMUNITY): Payer: Self-pay | Admitting: Emergency Medicine
